# Patient Record
Sex: Female | Born: 1995 | Race: Black or African American | Hispanic: No | Marital: Single | Smoking: Never smoker
Health system: Southern US, Community
[De-identification: ages and names within clinical notes are randomized; demographics above are authoritative.]

---

## 2017-06-28 ENCOUNTER — Encounter (HOSPITAL_COMMUNITY): Payer: Self-pay | Admitting: Emergency Medicine

## 2017-06-28 ENCOUNTER — Ambulatory Visit (HOSPITAL_COMMUNITY)
Admission: EM | Admit: 2017-06-28 | Discharge: 2017-06-28 | Disposition: A | Discharge status: Home / Self Care | Payer: BLUE CROSS/BLUE SHIELD | Attending: Physician Assistant | Admitting: Physician Assistant

## 2017-06-28 DIAGNOSIS — Z882 Allergy status to sulfonamides status: Secondary | ICD-10-CM | POA: Diagnosis not present

## 2017-06-28 DIAGNOSIS — J039 Acute tonsillitis, unspecified: Secondary | ICD-10-CM

## 2017-06-28 DIAGNOSIS — R1310 Dysphagia, unspecified: Secondary | ICD-10-CM | POA: Diagnosis not present

## 2017-06-28 DIAGNOSIS — R05 Cough: Secondary | ICD-10-CM | POA: Insufficient documentation

## 2017-06-28 DIAGNOSIS — J029 Acute pharyngitis, unspecified: Secondary | ICD-10-CM | POA: Diagnosis present

## 2017-06-28 DIAGNOSIS — Z79899 Other long term (current) drug therapy: Secondary | ICD-10-CM | POA: Diagnosis not present

## 2017-06-28 DIAGNOSIS — R070 Pain in throat: Secondary | ICD-10-CM

## 2017-06-28 DIAGNOSIS — Z91018 Allergy to other foods: Secondary | ICD-10-CM | POA: Diagnosis not present

## 2017-06-28 LAB — POCT INFECTIOUS MONO SCREEN: Mono Screen: NEGATIVE

## 2017-06-28 LAB — POCT RAPID STREP A: Streptococcus, Group A Screen (Direct): NEGATIVE

## 2017-06-28 MED ORDER — PHENOL 1.4 % MT LIQD: 1.0000 | OROMUCOSAL | 0 refills | Status: DC | PRN

## 2017-06-28 MED ORDER — FLUTICASONE PROPIONATE 50 MCG/ACT NA SUSP: 2.0000 | Freq: Every day | NASAL | 0 refills | Status: AC

## 2017-06-28 MED ORDER — CETIRIZINE-PSEUDOEPHEDRINE ER 5-120 MG PO TB12: 1.0000 | ORAL_TABLET | Freq: Every day | ORAL | 0 refills | Status: DC

## 2017-06-28 MED ORDER — PENICILLIN V POTASSIUM 500 MG PO TABS: 500.0000 mg | ORAL_TABLET | Freq: Two times a day (BID) | ORAL | 0 refills | Status: DC

## 2017-06-28 NOTE — ED Provider Notes (Signed)
[facility]    CSN: [id number] Arrival date & time: 06/28/17  1046     History   Chief Complaint Chief Complaint  Patient presents with  . Sore Throat    HPI Lori Anderson is a 21 y.o. female.   21 year old female with 3 day history of sorethroat with dysphagia. Has had some mild congestion and coughing. Feeling weak and tired. Denies ear pain, eye pain. Has had some night sweats, without fever/chills. Cough can be productive. Painful with swallowing but able to go down, denies trouble breathing. Denies chest pain, shortness of breath, wheezing. Has tried cough drops, ibuprofen without relief. Denies sick contact. Denies history of seasonal allergies, asthma.       History reviewed. No pertinent past medical history.  There are no active problems to display for this patient.   History reviewed. No pertinent surgical history.  OB History    No data available       Home Medications    Prior to Admission medications   Medication Sig Start Date End Date Taking? Authorizing Provider  norethindrone-ethinyl estradiol-iron (MICROGESTIN FE,GILDESS FE,LOESTRIN FE) 1.5-30 MG-MCG tablet Take 1 tablet by mouth daily.   Yes [provider]  cetirizine-pseudoephedrine (ZYRTEC-D) 5-120 MG tablet Take 1 tablet by mouth daily. 06/28/17   Cathie Hoops, Amy V, PA-C  fluticasone (FLONASE) 50 MCG/ACT nasal spray Place 2 sprays into both nostrils daily. 06/28/17   Cathie Hoops, Amy V, PA-C  penicillin v potassium (VEETID) 500 MG tablet Take 1 tablet (500 mg total) by mouth 2 (two) times daily. 06/28/17   Cathie Hoops, Amy V, PA-C  phenol (CHLORASEPTIC) 1.4 % LIQD Use as directed 1 spray in the mouth or throat as needed for throat irritation / pain. 06/28/17   Belinda Fisher, PA-C    Family History History reviewed. No pertinent family history.  Social History Social History  Substance Use Topics  . Smoking status: Never Smoker  . Smokeless tobacco: Never Used  . Alcohol use No     Allergies     Cinnamon and Sulfa antibiotics   Review of Systems Review of Systems  Reason unable to perform ROS: See HPI as above.     Physical Exam Triage Vital Signs ED Triage Vitals  Enc Vitals Group     BP 06/28/17 1119 (!) 106/59     Pulse Rate 06/28/17 1119 75     Resp 06/28/17 1119 20     Temp 06/28/17 1119 98.6 F (37 C)     Temp Source 06/28/17 1119 Oral     SpO2 06/28/17 1119 100 %     Weight --      Height --      Head Circumference --      Peak Flow --      Pain Score 06/28/17 1120 5     Pain Loc --      Pain Edu? --      Excl. in GC? --    No data found.   Updated Vital Signs BP (!) 106/59 (BP Location: Left Arm)   Pulse 75   Temp 98.6 F (37 C) (Oral)   Resp 20   LMP 06/25/2017   SpO2 100%   Physical Exam  Constitutional: She is oriented to person, place, and time. She appears well-developed and well-nourished. No distress.  HENT:  Head: Normocephalic and atraumatic.  Right Ear: Tympanic membrane, external ear and ear canal normal. Tympanic membrane is not erythematous and not bulging.  Left Ear:  External ear and ear canal normal. Tympanic membrane is erythematous. Tympanic membrane is not bulging.  Nose: Mucosal edema and rhinorrhea present. Right sinus exhibits no maxillary sinus tenderness and no frontal sinus tenderness. Left sinus exhibits no maxillary sinus tenderness and no frontal sinus tenderness.  Mouth/Throat: Uvula is midline and mucous membranes are normal. Posterior oropharyngeal erythema present. Tonsils are 3+ on the right. Tonsils are 3+ on the left. Tonsillar exudate (bilateral).  Eyes: Pupils are equal, round, and reactive to light. Conjunctivae are normal.  Neck: Normal range of motion. Neck supple.  Cardiovascular: Normal rate, regular rhythm and normal heart sounds.  Exam reveals no gallop and no friction rub.   No murmur heard. Pulmonary/Chest: Effort normal and breath sounds normal. She has no decreased breath sounds. She has no wheezes.  She has no rhonchi. She has no rales.  Lymphadenopathy:    She has no cervical adenopathy.  Neurological: She is alert and oriented to person, place, and time.  Skin: Skin is warm and dry.  Psychiatric: She has a normal mood and affect. Her behavior is normal. Judgment normal.     UC Treatments / Results  Labs (all labs ordered are listed, but only abnormal results are displayed) Labs Reviewed  CULTURE, GROUP A STREP [facility])  POCT RAPID STREP A  POCT INFECTIOUS MONO SCREEN    EKG  EKG Interpretation None       Radiology No results found.  Procedures Procedures (including critical care time)  Medications Ordered in UC Medications - No data to display   Initial Impression / Assessment and Plan / UC Course  I have reviewed the triage vital signs and the nursing notes.  Pertinent labs & imaging results that were available during my care of the patient were reviewed by me and considered in my medical decision making (see chart for details).    Negative strep and mono. Given exam, will treat for tonsillitis, start PCN as directed. Other symptomatic treatment provided. Return precautions given.   Final Clinical Impressions(s) / UC Diagnoses   Final diagnoses:  Acute tonsillitis, unspecified etiology    New Prescriptions New Prescriptions   CETIRIZINE-PSEUDOEPHEDRINE (ZYRTEC-D) 5-120 MG TABLET    Take 1 tablet by mouth daily.   FLUTICASONE (FLONASE) 50 MCG/ACT NASAL SPRAY    Place 2 sprays into both nostrils daily.   PENICILLIN V POTASSIUM (VEETID) 500 MG TABLET    Take 1 tablet (500 mg total) by mouth 2 (two) times daily.   PHENOL (CHLORASEPTIC) 1.4 % LIQD    Use as directed 1 spray in the mouth or throat as needed for throat irritation / pain.      Belinda Fisher, PA-C 06/28/17 1257

## 2017-06-28 NOTE — Discharge Instructions (Signed)
Rapid strep negative. Negative mono. Given exam will treat for tonsillitis, start penicillin as directed.  Start phenol for sore throat. Flonase and/or Zyrtec-D for nasal congestion. You can use over the counter nasal saline rinse such as neti pot for nasal congestion. Monitor for any worsening of symptoms, swelling of the throat, trouble breathing, trouble swallowing, follow up for reevaluation.

## 2017-06-28 NOTE — ED Triage Notes (Signed)
Pt here for ST onset 3 days associated dysphagia  Denies fevers, chills  A&O x4... NAD... Ambulatory

## 2017-07-01 LAB — CULTURE, GROUP A STREP (THRC)

## 2017-08-24 ENCOUNTER — Ambulatory Visit (INDEPENDENT_AMBULATORY_CARE_PROVIDER_SITE_OTHER): Payer: BLUE CROSS/BLUE SHIELD

## 2017-08-24 ENCOUNTER — Encounter (HOSPITAL_COMMUNITY): Payer: Self-pay | Admitting: Family Medicine

## 2017-08-24 ENCOUNTER — Ambulatory Visit (HOSPITAL_COMMUNITY)
Admission: EM | Admit: 2017-08-24 | Discharge: 2017-08-24 | Disposition: A | Discharge status: Home / Self Care | Payer: BLUE CROSS/BLUE SHIELD | Attending: Family Medicine | Admitting: Family Medicine

## 2017-08-24 DIAGNOSIS — S40021A Contusion of right upper arm, initial encounter: Secondary | ICD-10-CM

## 2017-08-24 DIAGNOSIS — S20211A Contusion of right front wall of thorax, initial encounter: Secondary | ICD-10-CM

## 2017-08-24 MED ORDER — CYCLOBENZAPRINE HCL 5 MG PO TABS: 5.0000 mg | ORAL_TABLET | Freq: Every day | ORAL | 0 refills | Status: DC

## 2017-08-24 MED ORDER — DICLOFENAC SODIUM 75 MG PO TBEC: 75.0000 mg | DELAYED_RELEASE_TABLET | Freq: Two times a day (BID) | ORAL | 0 refills | Status: DC

## 2017-08-24 NOTE — ED Triage Notes (Signed)
Pt here for right arm and shoulder pain. Reports that she injured it by sliding down the stairs today,

## 2017-08-24 NOTE — Discharge Instructions (Signed)
X-rays show no fractures.

## 2017-08-24 NOTE — ED Provider Notes (Signed)
[facility]   [id number] 08/24/17 Arrival Time: 1539   SUBJECTIVE:  Lori Anderson is a 21 y.o. female who presents to the urgent care with complaint of right arm and shoulder pain. Reports that she injured it by sliding down the stairs today,   No shortness of breath or head injury Patient complains of pain in her right ribs and aching in her humerus and forearm.  She has pain when she tries to raise her arm over her head.  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   History reviewed. No pertinent past medical history. History reviewed. No pertinent family history. Social History   Socioeconomic History  . Marital status: Single    Spouse name: Not on file  . Number of children: Not on file  . Years of education: Not on file  . Highest education level: Not on file  Social Needs  . Financial resource strain: Not on file  . Food insecurity - worry: Not on file  . Food insecurity - inability: Not on file  . Transportation needs - medical: Not on file  . Transportation needs - non-medical: Not on file  Occupational History  . Not on file  Tobacco Use  . Smoking status: Never Smoker  . Smokeless tobacco: Never Used  Substance and Sexual Activity  . Alcohol use: No  . Drug use: No  . Sexual activity: Yes    Birth control/protection: Pill, Condom  Other Topics Concern  . Not on file  Social History Narrative  . Not on file   No outpatient medications have been marked as taking for the 08/24/17 encounter [facility](Hospital Encounter).   Allergies  Allergen Reactions  . Cinnamon Itching  . Sulfa Antibiotics Swelling      ROS: As per HPI, remainder of ROS negative.   OBJECTIVE:   Vitals:   08/24/17 1608  BP: 113/68  Pulse: 73  Resp: 18  Temp: 98.2 F (36.8 C)  SpO2: 99%     General appearance: alert; no distress Eyes: PERRL; EOMI; conjunctiva normal HENT: normocephalic;  atraumatic; external ears normal without trauma; nasal mucosa normal; oral mucosa normal Neck: supple Lungs: clear to auscultation bilaterally; tender right anterolateral ribs Heart: regular rate and rhythm Abdomen: soft, non-tender;  Back: no CVA tenderness Extremities: no cyanosis or edema; symmetrical with no gross deformities Skin: warm and dry Neurologic: normal gait; grossly normal Psychological: alert and cooperative; normal mood and affect      Labs:  Results for orders placed or performed during the hospital encounter of 06/28/17  Culture, group A strep  Result Value Ref Range   Specimen Description THROAT    Special Requests NONE    Culture NO GROUP A STREP (S.PYOGENES) ISOLATED    Report Status 07/01/2017 FINAL   POCT rapid strep A [facility])  Result Value Ref Range   Streptococcus, Group A Screen (Direct) NEGATIVE NEGATIVE  Infectious mono screen, POC  Result Value Ref Range   Mono Screen NEGATIVE NEGATIVE    Labs Reviewed - No data to display  No results found.     ASSESSMENT & PLAN:  1. Contusion of right chest wall, initial encounter   2. Arm contusion, right, initial encounter     Meds ordered this encounter  Medications  . diclofenac (VOLTAREN) 75 MG EC tablet    Sig: Take 1 tablet (75 mg total) by mouth 2 (two) times daily.    Dispense:  14  tablet    Refill:  0  . cyclobenzaprine (FLEXERIL) 5 MG tablet    Sig: Take 1 tablet (5 mg total) by mouth at bedtime.    Dispense:  7 tablet    Refill:  0    Reviewed expectations re: course of current medical issues. Questions answered. Outlined signs and symptoms indicating need for more acute intervention. Patient verbalized understanding. After Visit Summary given.    Procedures:      Elvina SidleLauenstein, Savannaha Stonerock, MD 08/24/17 1728

## 2018-08-04 ENCOUNTER — Ambulatory Visit (HOSPITAL_COMMUNITY)
Admission: EM | Admit: 2018-08-04 | Discharge: 2018-08-04 | Disposition: A | Discharge status: Home / Self Care | Payer: BLUE CROSS/BLUE SHIELD | Attending: Family Medicine | Admitting: Family Medicine

## 2018-08-04 ENCOUNTER — Encounter (HOSPITAL_COMMUNITY): Payer: Self-pay | Admitting: Emergency Medicine

## 2018-08-04 DIAGNOSIS — M549 Dorsalgia, unspecified: Secondary | ICD-10-CM | POA: Insufficient documentation

## 2018-08-04 DIAGNOSIS — J039 Acute tonsillitis, unspecified: Secondary | ICD-10-CM | POA: Diagnosis not present

## 2018-08-04 DIAGNOSIS — H9201 Otalgia, right ear: Secondary | ICD-10-CM | POA: Insufficient documentation

## 2018-08-04 DIAGNOSIS — Z882 Allergy status to sulfonamides status: Secondary | ICD-10-CM | POA: Diagnosis not present

## 2018-08-04 LAB — POCT URINALYSIS DIP (DEVICE)
Bilirubin Urine: NEGATIVE
Glucose, UA: NEGATIVE mg/dL
Hgb urine dipstick: NEGATIVE
Ketones, ur: NEGATIVE mg/dL
Leukocytes, UA: NEGATIVE
Nitrite: NEGATIVE
Protein, ur: NEGATIVE mg/dL
Specific Gravity, Urine: 1.02 (ref 1.005–1.030)
Urobilinogen, UA: 0.2 mg/dL (ref 0.0–1.0)
pH: 7 (ref 5.0–8.0)

## 2018-08-04 LAB — POCT RAPID STREP A: Streptococcus, Group A Screen (Direct): NEGATIVE

## 2018-08-04 MED ORDER — IBUPROFEN 600 MG PO TABS: 600.0000 mg | ORAL_TABLET | Freq: Four times a day (QID) | ORAL | 0 refills | Status: AC | PRN

## 2018-08-04 MED ORDER — CYCLOBENZAPRINE HCL 5 MG PO TABS: 5.0000 mg | ORAL_TABLET | Freq: Every day | ORAL | 0 refills | Status: AC

## 2018-08-04 NOTE — Discharge Instructions (Addendum)
Sore Throat  Your rapid strep tested Negative today. We will send for a culture and call in about 2 days if results are positive. For now we will treat your sore throat as a virus with symptom management.   Please continue Tylenol or Ibuprofen for fever and pain. May try salt water gargles, cepacol lozenges, throat spray, or OTC cold relief medicine for throat discomfort. If you also have congestion take a daily anti-histamine like Zyrtec, Claritin, and a oral decongestant to help with post nasal drip that may be irritating your throat.   Stay hydrated and drink plenty of fluids to keep your throat coated relieve irritation.   Please use ibuprofen and flexeril for back pain Use anti-inflammatories for pain/swelling. You may take up to 800 mg Ibuprofen every 8 hours with food. You may supplement Ibuprofen with Tylenol [phone] mg every 8 hours.   You may use flexeril as needed to help with pain. This is a muscle relaxer and causes sedation- please use only at bedtime or when you will be home and not have to drive/work  Please follow up if not improving or worsening

## 2018-08-04 NOTE — ED Triage Notes (Signed)
Pt sts right ear pain and right side and back pain x 3 days

## 2018-08-05 NOTE — ED Provider Notes (Signed)
[facility]    CSN: [id number] Arrival date & time: 08/04/18  1236     History   Chief Complaint Chief Complaint  Patient presents with  . Otalgia  . Flank Pain    HPI Lori Anderson is a 22 y.o. female no significant past medical history of present today for evaluation right ear pain, sore throat as well as back pain.  Patient states that for the past 2 days she is developed right-sided ear pain with a mild sore throat.  He states that she has a history of getting recurrent tonsillitis and has an evaluation of her tonsils removed soon.  She denies fevers.  Denies associated rhinorrhea, congestion or cough.  She has tried Motrin which has helped a little. She is also had right-side/ back pain.  This is been present for the past 3 days.  Worse with certain movements.  She denies dysuria, has noted increased frequency of urination.  Denies hematuria.  Denies history of kidney stones.  The Motrin has also provided her with mild relief with this.  Denies any specific injury, increase in activity or heavy lifting.  Denies saddle anesthesia.  Denies weakness in legs.  Denies loss of bowel or bladder control.  HPI  History reviewed. No pertinent past medical history.  There are no active problems to display for this patient.   History reviewed. No pertinent surgical history.  OB History   None      Home Medications    Prior to Admission medications   Medication Sig Start Date End Date Taking? Authorizing Provider  cyclobenzaprine (FLEXERIL) 5 MG tablet Take 1 tablet (5 mg total) by mouth at bedtime for 7 days. 08/04/18 08/11/18  Shakala Marlatt C, PA-C  fluticasone (FLONASE) 50 MCG/ACT nasal spray Place 2 sprays into both nostrils daily. 06/28/17   Cathie HoopsYu, Amy V, PA-C  ibuprofen (ADVIL,MOTRIN) 600 MG tablet Take 1 tablet (600 mg total) by mouth every 6 (six) hours as needed. 08/04/18   Dinari Stgermaine C, PA-C  norethindrone-ethinyl estradiol-iron (MICROGESTIN FE,GILDESS  FE,LOESTRIN FE) 1.5-30 MG-MCG tablet Take 1 tablet by mouth daily.    [provider]    Family History History reviewed. No pertinent family history.  Social History Social History   Tobacco Use  . Smoking status: Never Smoker  . Smokeless tobacco: Never Used  Substance Use Topics  . Alcohol use: No  . Drug use: No     Allergies   Cinnamon and Sulfa antibiotics   Review of Systems Review of Systems  Constitutional: Negative for activity change, appetite change, chills, fatigue and fever.  HENT: Positive for ear pain and sore throat. Negative for congestion, rhinorrhea, sinus pressure and trouble swallowing.   Eyes: Negative for discharge and redness.  Respiratory: Negative for cough, chest tightness and shortness of breath.   Cardiovascular: Negative for chest pain.  Gastrointestinal: Negative for abdominal pain, diarrhea, nausea and vomiting.  Genitourinary: Positive for frequency. Negative for decreased urine volume, difficulty urinating, dysuria and vaginal discharge.  Musculoskeletal: Positive for back pain and myalgias.  Skin: Negative for rash.  Neurological: Negative for dizziness, light-headedness and headaches.     Physical Exam Triage Vital Signs ED Triage Vitals [08/04/18 1320]  Enc Vitals Group     BP 121/86     Pulse Rate 72     Resp 18     Temp 98.3 F (36.8 C)     Temp Source Oral     SpO2 100 %  Weight      Height      Head Circumference      Peak Flow      Pain Score 6     Pain Loc      Pain Edu?      Excl. in GC?    No data found.  Updated Vital Signs BP 121/86 (BP Location: Left Arm)   Pulse 72   Temp 98.3 F (36.8 C) (Oral)   Resp 18   SpO2 100%   Visual Acuity Right Eye Distance:   Left Eye Distance:   Bilateral Distance:    Right Eye Near:   Left Eye Near:    Bilateral Near:     Physical Exam  Constitutional: She appears well-developed and well-nourished. No distress.  HENT:  Head: Normocephalic and  atraumatic.  Bilateral ears without tenderness to palpation of external auricle, tragus and mastoid, right EAC very minimally erythematous, without swelling, TM's with good bony landmarks and cone of light. Non erythematous.  Nasal mucosa pink, no rhinorrhea  Oral mucosa pink and moist, right tonsil enlarged compared to left, no erythema or exudate present, no soft palate swelling. Posterior pharynx patent and nonerythematous, no uvula deviation or swelling. Normal phonation.  Eyes: Conjunctivae are normal.  Neck: Neck supple.  Cardiovascular: Normal rate and regular rhythm.  No murmur heard. Pulmonary/Chest: Effort normal and breath sounds normal. No respiratory distress.  Breathing comfortably at rest, CTABL, no wheezing, rales or other adventitious sounds auscultated  Abdominal: Soft. There is no tenderness.  Nontender to light deep palpation throughout abdomen  Musculoskeletal: She exhibits no edema.  Nontender to palpation of cervical, thoracic and lumbar spine midline, mild tenderness to palpation over right lumbar musculature into flank  Neurological: She is alert.  Skin: Skin is warm and dry.  Psychiatric: She has a normal mood and affect.  Nursing note and vitals reviewed.    UC Treatments / Results  Labs (all labs ordered are listed, but only abnormal results are displayed) Labs Reviewed  CULTURE, GROUP A STREP [facility])  POCT RAPID STREP A  POCT URINALYSIS DIP (DEVICE)    EKG None  Radiology No results found.  Procedures Procedures (including critical care time)  Medications Ordered in UC Medications - No data to display  Initial Impression / Assessment and Plan / UC Course  I have reviewed the triage vital signs and the nursing notes.  Pertinent labs & imaging results that were available during my care of the patient were reviewed by me and considered in my medical decision making (see chart for details).     Ear pain likely secondary to enlarged right  tonsil, strep negative, does not appear to be.  Abscess at this time although unilateral swelling.  No overlying erythema, will treat as viral, recommend symptomatic management.  Recommendations below.  UA checked given flank pain, normal.  Most likely musculoskeletal given reproducible on exam.  Will also recommend anti-inflammatories/muscle relaxer.  Continue to monitor symptoms,Discussed strict return precautions. Patient verbalized understanding and is agreeable with plan.  Final Clinical Impressions(s) / UC Diagnoses   Final diagnoses:  Right ear pain  Acute tonsillitis, unspecified etiology     Discharge Instructions     Sore Throat  Your rapid strep tested Negative today. We will send for a culture and call in about 2 days if results are positive. For now we will treat your sore throat as a virus with symptom management.   Please continue Tylenol or Ibuprofen for fever and  pain. May try salt water gargles, cepacol lozenges, throat spray, or OTC cold relief medicine for throat discomfort. If you also have congestion take a daily anti-histamine like Zyrtec, Claritin, and a oral decongestant to help with post nasal drip that may be irritating your throat.   Stay hydrated and drink plenty of fluids to keep your throat coated relieve irritation.   Please use ibuprofen and flexeril for back pain Use anti-inflammatories for pain/swelling. You may take up to 800 mg Ibuprofen every 8 hours with food. You may supplement Ibuprofen with Tylenol [phone] mg every 8 hours.   You may use flexeril as needed to help with pain. This is a muscle relaxer and causes sedation- please use only at bedtime or when you will be home and not have to drive/work  Please follow up if not improving or worsening   ED Prescriptions    Medication Sig Dispense Auth. Provider   cyclobenzaprine (FLEXERIL) 5 MG tablet Take 1 tablet (5 mg total) by mouth at bedtime for 7 days. 7 tablet Rodolphe Edmonston C, PA-C    ibuprofen (ADVIL,MOTRIN) 600 MG tablet Take 1 tablet (600 mg total) by mouth every 6 (six) hours as needed. 30 tablet Lavi Sheehan, [place] C, PA-C     Controlled Substance Prescriptions [place] Controlled Substance Registry consulted? Not Applicable   Lew Dawes, [place] 08/05/18 [phone]

## 2018-08-07 LAB — CULTURE, GROUP A STREP (THRC)

## 2019-11-20 IMAGING — DX DG SHOULDER 2+V*R*
4 series · 4 of 4 positions shown · non-contrast
Comparison: None.

CLINICAL DATA: Fall with right rib and shoulder pain. Initial
encounter.

EXAM:
RIGHT SHOULDER - 2+ VIEW

[shoulder ap]
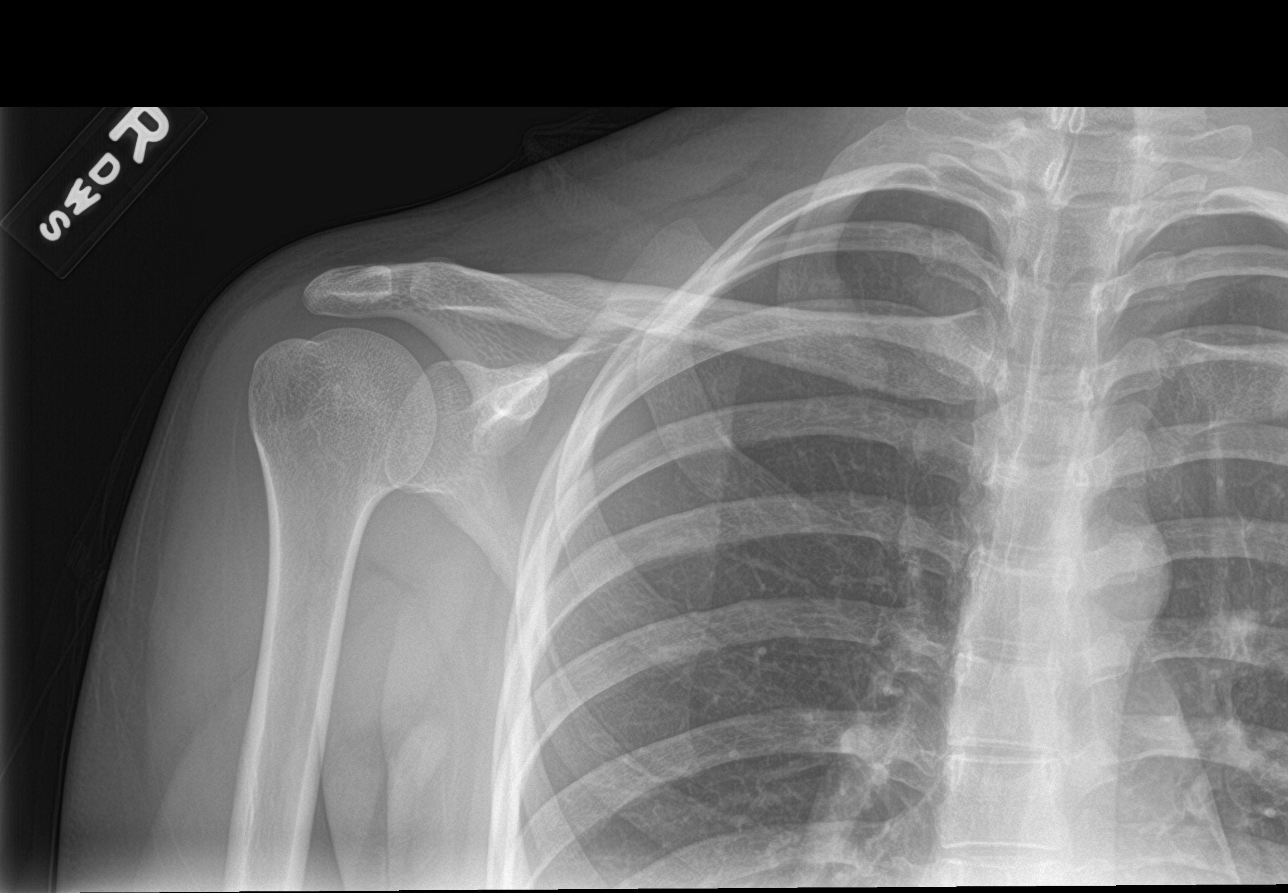

[shoulder grashey]
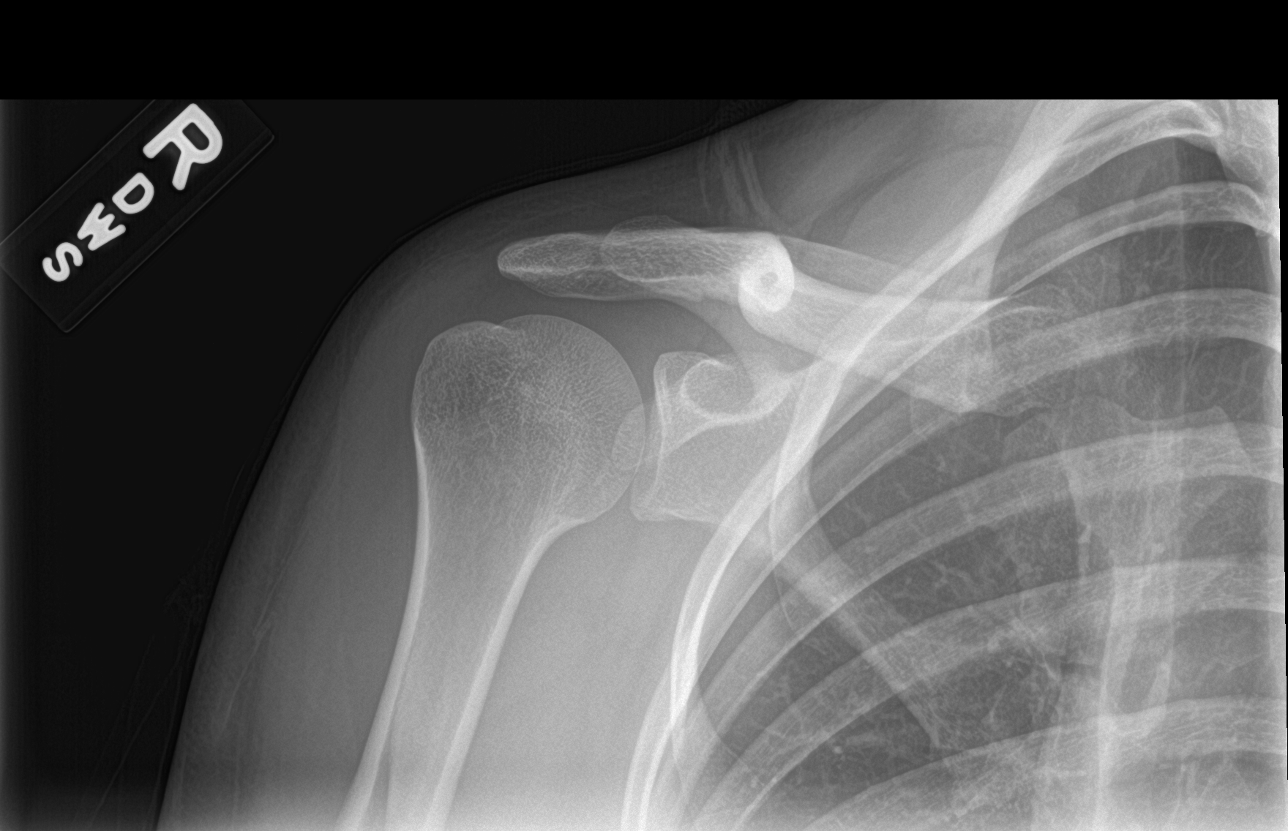

[shoulder y-view]
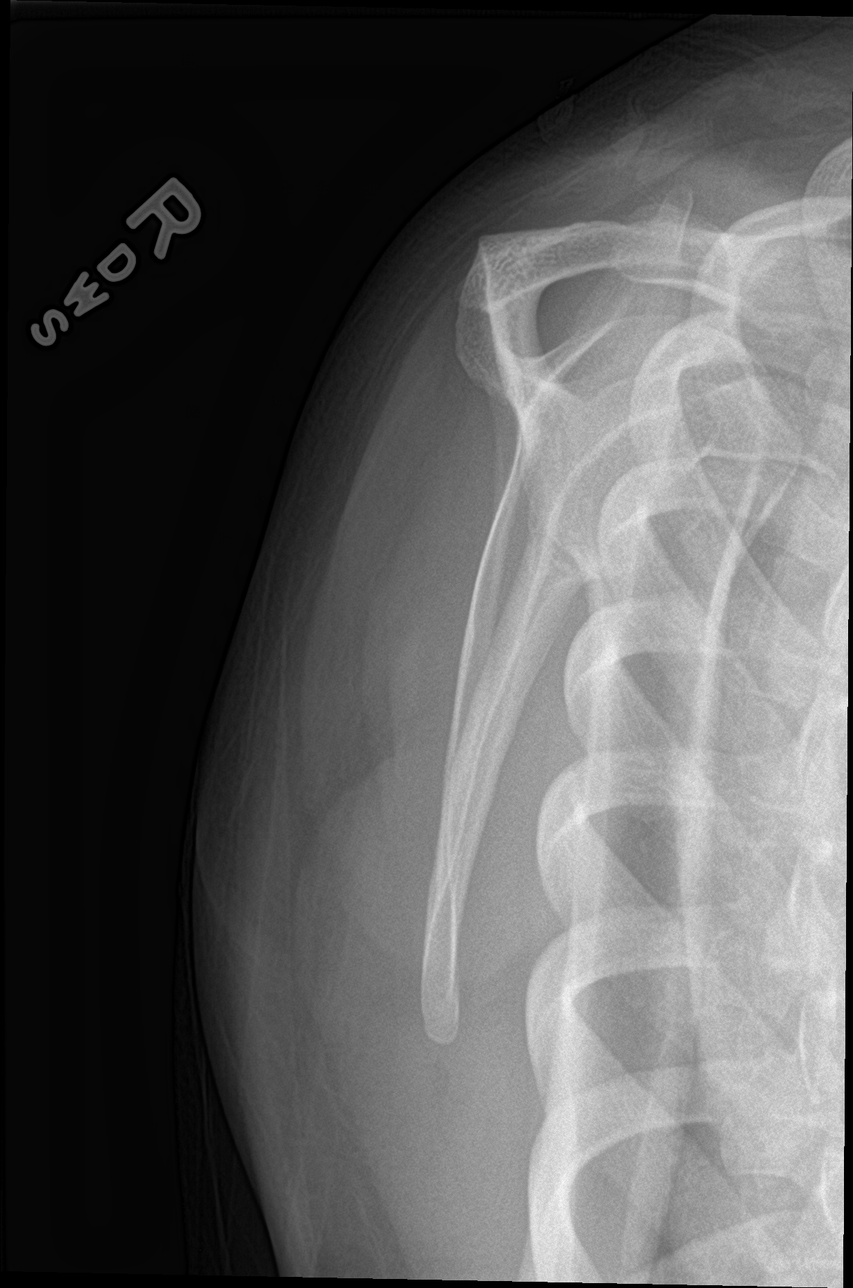

[shoulder axial]
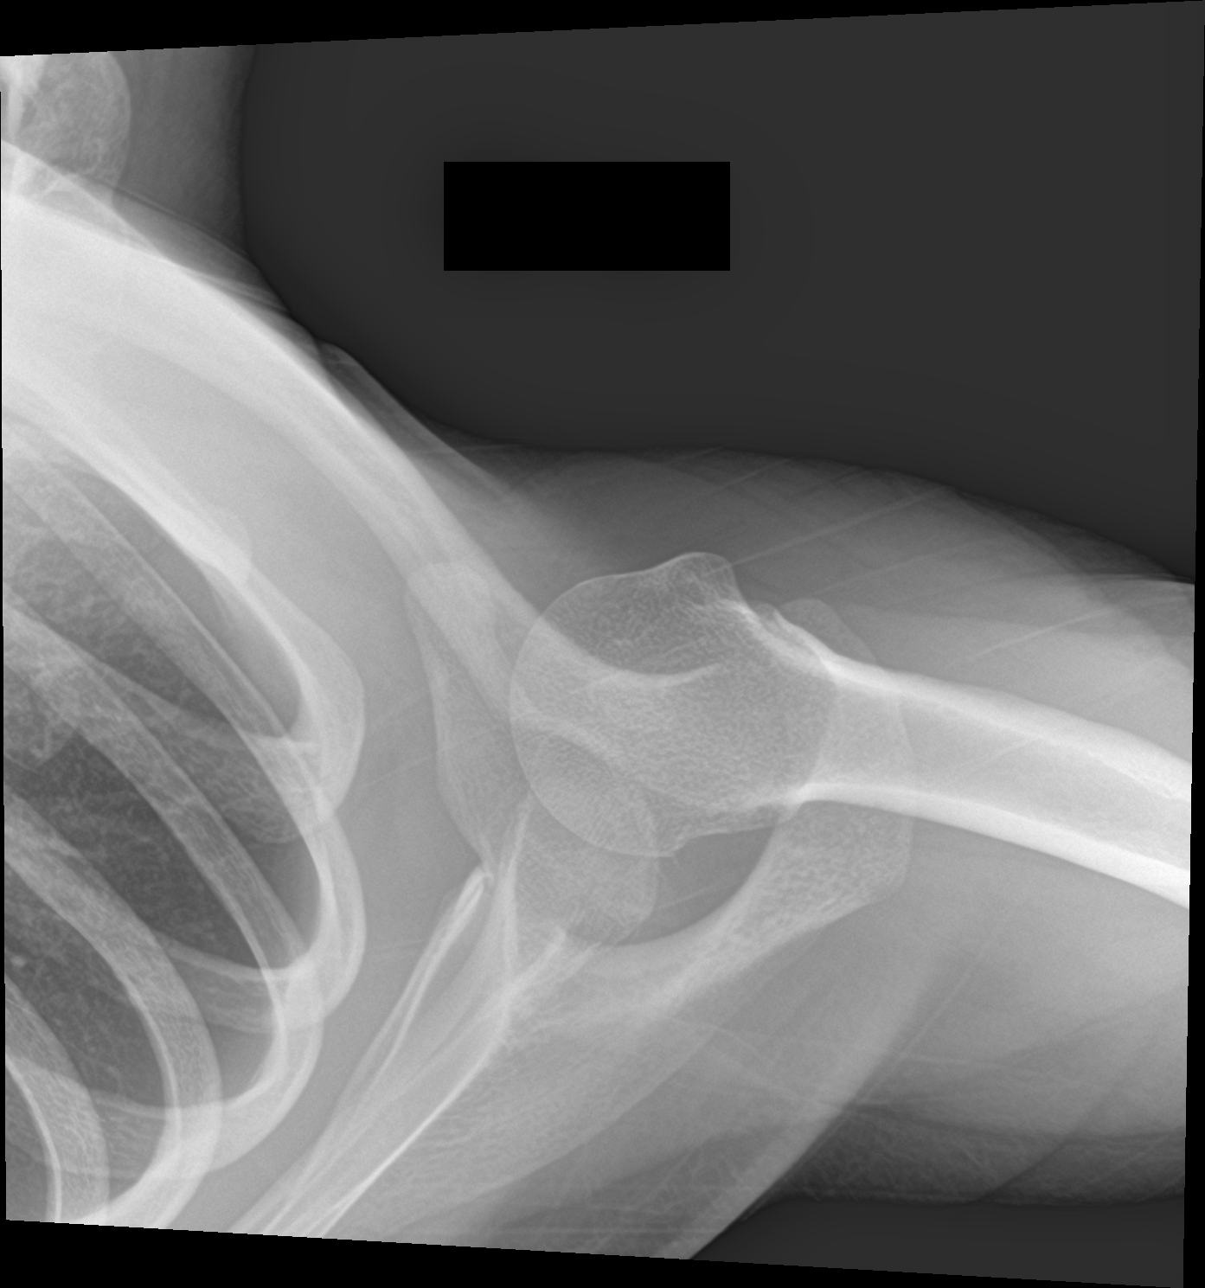

[4 of 4 positions shown; findings below may reference images not displayed]

FINDINGS: There is no evidence of fracture or dislocation. There is no
evidence of arthropathy or other focal bone abnormality. Soft
tissues are unremarkable.
IMPRESSION: Negative.

## 2019-12-26 ENCOUNTER — Ambulatory Visit: Payer: BC Managed Care – PPO | Attending: Family

## 2019-12-26 DIAGNOSIS — Z23 Encounter for immunization: Secondary | ICD-10-CM

## 2019-12-26 NOTE — Progress Notes (Signed)
   Covid-19 Vaccination Clinic  Name:  Lori Anderson    MRN: [id number] DOB: 07/17/1996  12/26/2019  [name] was observed post Covid-19 immunization for 15 minutes without incident. She was provided with Vaccine Information Sheet and instruction to access the V-Safe system.   [name] was instructed to call 911 with any severe reactions post vaccine: Marland Kitchen Difficulty breathing  . Swelling of face and throat  . A fast heartbeat  . A bad rash all over body  . Dizziness and weakness   Immunizations Administered    Name Date Dose VIS Date Route   JANSSEN COVID-19 VACCINE 12/26/2019 12:13 PM 0.5 mL 11/21/2019 Intramuscular   Manufacturer: [employer]   Lot: [id number]   NDC: 20254-270-62

## 2020-06-15 ENCOUNTER — Ambulatory Visit: Payer: BC Managed Care – PPO | Admitting: Podiatry

## 2020-06-16 ENCOUNTER — Ambulatory Visit (INDEPENDENT_AMBULATORY_CARE_PROVIDER_SITE_OTHER): Payer: BC Managed Care – PPO | Admitting: Podiatry

## 2020-06-16 ENCOUNTER — Ambulatory Visit (INDEPENDENT_AMBULATORY_CARE_PROVIDER_SITE_OTHER): Payer: BC Managed Care – PPO

## 2020-06-16 ENCOUNTER — Other Ambulatory Visit: Payer: Self-pay

## 2020-06-16 DIAGNOSIS — Q666 Other congenital valgus deformities of feet: Secondary | ICD-10-CM

## 2020-06-16 DIAGNOSIS — M21611 Bunion of right foot: Secondary | ICD-10-CM

## 2020-06-17 ENCOUNTER — Encounter: Payer: Self-pay | Admitting: Podiatry

## 2020-06-17 NOTE — Progress Notes (Signed)
Subjective:  Patient ID: Lori Anderson, female    DOB: 09-10-1996,  MRN: [id number]  Chief Complaint  Patient presents with  . Foot Pain    Right foot- pt states she is has been dealing with a dull/ throbbing ache- almost a month- no meds taken - further evlaution     24 y.o. female presents with the above complaint. Patient presents with complaint of right bunion pain that has been going on for 1 month. She states that she has mild occasional discomfort. She just wants more information on the bunion and the surgical treatment options. She has tried various conservative treatment options. She does not want to do surgery at this time however she wants to discuss options for it. She states that starts throbbing and achy when she is standing on it for long period of time. She has not seen anyone else prior to seeing me.   Review of Systems: Negative except as noted in the HPI. Denies N/V/F/Ch.  No past medical history on file.  Current Outpatient Medications:  .  clindamycin (CLEOCIN T) 1 % lotion, Apply topically., Disp: , Rfl:  .  fluticasone (FLONASE) 50 MCG/ACT nasal spray, Place 2 sprays into both nostrils daily., Disp: 1 g, Rfl: 0 .  ibuprofen (ADVIL,MOTRIN) 600 MG tablet, Take 1 tablet (600 mg total) by mouth every 6 (six) hours as needed., Disp: 30 tablet, Rfl: 0 .  LO LOESTRIN FE 1 MG-10 MCG / 10 MCG tablet, Take 1 tablet by mouth daily., Disp: , Rfl:  .  norethindrone-ethinyl estradiol-iron (MICROGESTIN FE,GILDESS FE,LOESTRIN FE) 1.5-30 MG-MCG tablet, Take 1 tablet by mouth daily., Disp: , Rfl:  .  tretinoin (RETIN-A) 0.025 % cream, Apply topically., Disp: , Rfl:   Social History   Tobacco Use  Smoking Status Never Smoker  Smokeless Tobacco Never Used    Allergies  Allergen Reactions  . Sulfa Antibiotics Swelling and Anaphylaxis  . Cinnamon Itching   Objective:  There were no vitals filed for this visit. There is no height or weight on file to calculate  BMI. Constitutional Well developed. Well nourished.  Vascular Dorsalis pedis pulses palpable bilaterally. Posterior tibial pulses palpable bilaterally. Capillary refill normal to all digits.  No cyanosis or clubbing noted. Pedal hair growth normal.  Neurologic Normal speech. Oriented to person, place, and time. Epicritic sensation to light touch grossly present bilaterally.  Dermatologic Nails well groomed and normal in appearance. No open wounds. No skin lesions.  Orthopedic:  Right bunion deformity noted with pain on palpationReducible deformity with tracking not track bound. No intra-articular MPJ joint pain noted. Hallux valgus noted. Mild hammertoe contracture right l second digit noted. Gait examination shows calcaneal valgus with too many toe signs flattening of the arch upon weightbearing right partial of the recurrently arch with dorsiflexion of the hallux   Radiographs: 3 views of skeletally mature right foot: Moderate bunion deformity noted with increase in intermetatarsal angle. There is increasing hallux abductus valgus angle. Sesamoid position is 5 out of 7. Metatarsal parabola is intact. Hypertrophy of the medial eminence noted bilaterally. Mild hammertoe contracture of digit 2 noted bilaterally. Assessment:   1. Asymptomatic bunion of right foot   2. Pes planovalgus    Plan:  Patient was evaluated and treated and all questions answered.  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 I explained to the patient that the driving force behind the bunion deformities  a flatfeet/pes planovalgus. Ultimately we will need to control the underlying flatfeet in order for bunions to function properly regarding less of the surgery may. At this time patient would like to hold off on surgical discussion however she is interested in surgery in the future if it  continues to hurt. At this time patient will benefit from custom-made orthotics to control the hindfoot motion and support the arch of the foot and therefore stabilize her pes planovalgus. -She will be scheduled see rec for custom-made orthotics -  No follow-ups on file.

## 2020-06-21 ENCOUNTER — Other Ambulatory Visit: Payer: Self-pay

## 2020-06-21 ENCOUNTER — Ambulatory Visit (INDEPENDENT_AMBULATORY_CARE_PROVIDER_SITE_OTHER): Payer: BC Managed Care – PPO | Admitting: Orthotics

## 2020-06-21 DIAGNOSIS — M21611 Bunion of right foot: Secondary | ICD-10-CM

## 2020-06-21 DIAGNOSIS — Q666 Other congenital valgus deformities of feet: Secondary | ICD-10-CM | POA: Diagnosis not present

## 2020-06-21 NOTE — Progress Notes (Signed)
Cast today for CMFO to address right bunion pain/plantar..Plan   k-wedge offload under 1st mpj

## 2020-07-13 ENCOUNTER — Ambulatory Visit (INDEPENDENT_AMBULATORY_CARE_PROVIDER_SITE_OTHER): Payer: BC Managed Care – PPO | Admitting: Podiatry

## 2020-07-13 ENCOUNTER — Other Ambulatory Visit: Payer: Self-pay

## 2020-07-13 DIAGNOSIS — Q666 Other congenital valgus deformities of feet: Secondary | ICD-10-CM

## 2020-07-13 DIAGNOSIS — Z01818 Encounter for other preprocedural examination: Secondary | ICD-10-CM | POA: Diagnosis not present

## 2020-07-13 DIAGNOSIS — M2011 Hallux valgus (acquired), right foot: Secondary | ICD-10-CM

## 2020-07-14 ENCOUNTER — Encounter: Payer: Self-pay | Admitting: Podiatry

## 2020-07-14 NOTE — Progress Notes (Signed)
Subjective:  Patient ID: Lori Anderson, female    DOB: [birth year],  MRN: [id number]  Chief Complaint  Patient presents with  . Bunions    would like to discuss surgery     24 y.o. female presents with the above complaint.  Patient presents with a follow-up of right bunion pain that seems to have gotten progressively worse.  Patient states that all the shoes are now starting to hurt.  She would now like to discuss surgical intervention to have it corrected.  She states that she lives in [place] and he cannot follow-up after the surgery however she has a local podiatrist I will be more than happy to take care of the bunion.  She will follow up with Dr. Signa Kell.  She denies any other acute complaints and would like to proceed with surgery as she has failed all conservative treatment options   Review of Systems: Negative except as noted in the HPI. Denies N/V/F/Ch.  No past medical history on file.  Current Outpatient Medications:  .  clindamycin (CLEOCIN T) 1 % lotion, Apply topically., Disp: , Rfl:  .  fluticasone (FLONASE) 50 MCG/ACT nasal spray, Place 2 sprays into both nostrils daily., Disp: 1 g, Rfl: 0 .  ibuprofen (ADVIL,MOTRIN) 600 MG tablet, Take 1 tablet (600 mg total) by mouth every 6 (six) hours as needed., Disp: 30 tablet, Rfl: 0 .  LO LOESTRIN FE 1 MG-10 MCG / 10 MCG tablet, Take 1 tablet by mouth daily., Disp: , Rfl:  .  norethindrone-ethinyl estradiol-iron (MICROGESTIN FE,GILDESS FE,LOESTRIN FE) 1.5-30 MG-MCG tablet, Take 1 tablet by mouth daily., Disp: , Rfl:  .  tretinoin (RETIN-A) 0.025 % cream, Apply topically., Disp: , Rfl:   Social History   Tobacco Use  Smoking Status Never Smoker  Smokeless Tobacco Never Used    Allergies  Allergen Reactions  . Sulfa Antibiotics Swelling and Anaphylaxis  . Cinnamon Itching   Objective:  There were no vitals filed for this visit. There is no height or weight on file to calculate BMI. Constitutional Well  developed. Well nourished.  Vascular Dorsalis pedis pulses palpable bilaterally. Posterior tibial pulses palpable bilaterally. Capillary refill normal to all digits.  No cyanosis or clubbing noted. Pedal hair growth normal.  Neurologic Normal speech. Oriented to person, place, and time. Epicritic sensation to light touch grossly present bilaterally.  Dermatologic Nails well groomed and normal in appearance. No open wounds. No skin lesions.  Orthopedic:  Right bunion deformity noted with pain on palpationReducible deformity with tracking not track bound. No intra-articular MPJ joint pain noted. Hallux valgus noted. Mild hammertoe contracture right l second digit noted. Gait examination shows calcaneal valgus with too many toe signs flattening of the arch upon weightbearing right partial of the recurrently arch with dorsiflexion of the hallux   Radiographs: 3 views of skeletally mature right foot: Moderate bunion deformity noted with increase in intermetatarsal angle. There is increasing hallux abductus valgus angle. Sesamoid position is 5 out of 7. Metatarsal parabola is intact. Hypertrophy of the medial eminence noted bilaterally. Mild hammertoe contracture of digit 2 noted bilaterally. Assessment:   1. Hallux abducto valgus, right   2. Pes planovalgus   3. Preoperative examination    Plan:  Patient was evaluated and treated and all questions answered.  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 I explained to the patient that the driving force behind the bunion  deformities a flatfeet/pes planovalgus. Ultimately we will need to control the underlying flatfeet in order for bunions to function properly regarding less of the surgery may. -I discussed with her my surgical plans in extensive detail given that patient has failed all conservative therapy at this time  given that her pain in the bunion is getting worse I discussed surgical plans with it.  Patient is from [place] and would like to have postop protocol and stitches removal done by Dr. Signa Kell at his clinic.  I discussed with the patient that that should be absolutely fine.  I plan on performing chevron osteotomy with a possible Akin osteotomy. -I discussed my postop protocol as well as my surgical plan in extensive detail with the patient.  Patient states understanding would like to proceed with surgery. -Informed surgical risk consent was reviewed and read aloud to the patient.  I reviewed the films.  I have discussed my findings with the patient in great detail.  I have discussed all risks including but not limited to infection, stiffness, scarring, limp, disability, deformity, damage to blood vessels and nerves, numbness, poor healing, need for braces, arthritis, chronic pain, amputation, death.  All benefits and realistic expectations discussed in great detail.  I have made no promises as to the outcome.  I have provided realistic expectations.  I have offered the patient a 2nd opinion, which they have declined and assured me they preferred to proceed despite the risks -A total of 33 minutes was spent in direct patient care as well as pre and post patient encounter activities.  This includes documentation as well as reviewing patient chart for labs, imaging, past medical, surgical, social, and family history as documented in the EMR.  I have reviewed medication allergies as documented in EMR.  I discussed the etiology of condition and treatment options from conservative to surgical care.  All risks and benefit of the treatment course was discussed in detail.  All questions were answered and return appointment was discussed.  Since the visit completed in an ambulatory/outpatient setting, the patient and/or parent/guardian has been advised to contact the providers office for worsening condition and seek  medical treatment and/or call 911 if the patient deems either is necessary.  Pes planovalgus -She is scheduled see rec for custom-made orthotics No follow-ups on file.

## 2020-07-20 ENCOUNTER — Other Ambulatory Visit: Payer: Self-pay

## 2020-07-20 ENCOUNTER — Ambulatory Visit: Payer: BC Managed Care – PPO | Admitting: Orthotics

## 2020-07-20 DIAGNOSIS — Q666 Other congenital valgus deformities of feet: Secondary | ICD-10-CM

## 2020-07-20 DIAGNOSIS — M2011 Hallux valgus (acquired), right foot: Secondary | ICD-10-CM

## 2020-07-20 NOTE — Progress Notes (Signed)
Patient came in today to pick up custom made foot orthotics.  The goals were accomplished and the patient reported no dissatisfaction with said orthotics.  Patient was advised of breakin period and how to report any issues. 

## 2020-08-04 ENCOUNTER — Telehealth: Payer: Self-pay | Admitting: Podiatry

## 2020-08-04 NOTE — Telephone Encounter (Signed)
Patient is in a boot and scheduled for surgery in December, she would like a Handicap pass.

## 2020-08-05 NOTE — Telephone Encounter (Signed)
That is fine she can get it for 6 months

## 2020-08-25 ENCOUNTER — Telehealth: Payer: Self-pay | Admitting: Podiatry

## 2020-08-25 NOTE — Telephone Encounter (Signed)
DOS: 09/05/2020  Procedures: Liane Comber Bunionectomy Rt [phone]) and Double Osteotomy Rt ([id number])  BCBS Effective From: 09/24/2014 - 09/23/9998  Deductible: $1,200 with $1,200 and $0 remaining. Out of Pocket: $3,500 with $1,338 met and $2,162 remains. CoInsurance: 20% Copay: $0  Per May R no prior authorization or referrals are required. Call reference [phone]

## 2020-09-05 DIAGNOSIS — M2011 Hallux valgus (acquired), right foot: Secondary | ICD-10-CM | POA: Diagnosis not present

## 2020-09-05 MED ORDER — OXYCODONE-ACETAMINOPHEN 5-325 MG PO TABS: 1.0000 | ORAL_TABLET | ORAL | 0 refills | Status: AC | PRN

## 2020-09-05 MED ORDER — IBUPROFEN 800 MG PO TABS: 800.0000 mg | ORAL_TABLET | Freq: Four times a day (QID) | ORAL | 1 refills | Status: DC | PRN

## 2020-09-05 NOTE — Addendum Note (Signed)
Addended by: Nicholes Rough on: 09/05/2020 07:19 AM   Modules accepted: Orders

## 2020-09-08 ENCOUNTER — Telehealth: Payer: Self-pay | Admitting: Podiatry

## 2020-09-08 NOTE — Telephone Encounter (Signed)
Yes please cancel all appointments for this patient. Thank you

## 2020-09-08 NOTE — Telephone Encounter (Signed)
Patients mom has requested appointments be canceled and they have scheduled follow ups in [place] where patient was taken to recovery. Patients mom also stated they were told by you that once appointments were made they could call in and have POV with [facility] canceled, Please advise

## 2020-09-14 ENCOUNTER — Encounter: Payer: BC Managed Care – PPO | Admitting: Podiatry

## 2020-09-28 ENCOUNTER — Encounter: Payer: BC Managed Care – PPO | Admitting: Podiatry

## 2020-10-10 ENCOUNTER — Other Ambulatory Visit: Payer: Self-pay | Admitting: Podiatry

## 2020-10-14 ENCOUNTER — Other Ambulatory Visit: Payer: Self-pay

## 2020-10-14 ENCOUNTER — Ambulatory Visit (INDEPENDENT_AMBULATORY_CARE_PROVIDER_SITE_OTHER): Payer: BC Managed Care – PPO | Admitting: Podiatry

## 2020-10-14 ENCOUNTER — Ambulatory Visit (INDEPENDENT_AMBULATORY_CARE_PROVIDER_SITE_OTHER): Payer: BC Managed Care – PPO

## 2020-10-14 DIAGNOSIS — M2011 Hallux valgus (acquired), right foot: Secondary | ICD-10-CM | POA: Diagnosis not present

## 2020-10-14 DIAGNOSIS — Z01818 Encounter for other preprocedural examination: Secondary | ICD-10-CM

## 2020-10-14 DIAGNOSIS — Z9889 Other specified postprocedural states: Secondary | ICD-10-CM

## 2020-10-18 ENCOUNTER — Encounter: Payer: Self-pay | Admitting: Podiatry

## 2020-10-18 NOTE — Progress Notes (Signed)
  Subjective:  Patient ID: Lori Anderson, female    DOB: 1996-02-13,  MRN: [id number]  Chief Complaint  Patient presents with  . Routine Post Op    POV#1 -pt states pain and swelling is less; 8/10. -pt denies N/V/F/Ch - dressing dry, clean and intact Tx: icing, boot and heat    DOS: 09/05/2020 Procedure: Right [place] bunionectomy  25 y.o. female returns for post-op check.  Patient states that she is doing well.  She denies any other acute complaints.  She has been healing well.  She has been wearing her boot.  It does not hurt as much.  There is less swelling.  She was doing postop care with Dr. Signa Kell.  Review of Systems: Negative except as noted in the HPI. Denies N/V/F/Ch.  No past medical history on file.  Current Outpatient Medications:  .  clindamycin (CLEOCIN T) 1 % lotion, Apply topically., Disp: , Rfl:  .  fluticasone (FLONASE) 50 MCG/ACT nasal spray, Place 2 sprays into both nostrils daily., Disp: 1 g, Rfl: 0 .  ibuprofen (ADVIL) 800 MG tablet, TAKE 1 TABLET(800 MG) BY MOUTH EVERY 6 HOURS AS NEEDED, Disp: 60 tablet, Rfl: 1 .  ibuprofen (ADVIL,MOTRIN) 600 MG tablet, Take 1 tablet (600 mg total) by mouth every 6 (six) hours as needed., Disp: 30 tablet, Rfl: 0 .  LO LOESTRIN FE 1 MG-10 MCG / 10 MCG tablet, Take 1 tablet by mouth daily., Disp: , Rfl:  .  norethindrone-ethinyl estradiol-iron (MICROGESTIN FE,GILDESS FE,LOESTRIN FE) 1.5-30 MG-MCG tablet, Take 1 tablet by mouth daily., Disp: , Rfl:  .  oxyCODONE-acetaminophen (PERCOCET) 5-325 MG tablet, Take 1-2 tablets by mouth every 4 (four) hours as needed for severe pain., Disp: 30 tablet, Rfl: 0 .  tretinoin (RETIN-A) 0.025 % cream, Apply topically., Disp: , Rfl:   Social History   Tobacco Use  Smoking Status Never Smoker  Smokeless Tobacco Never Used    Allergies  Allergen Reactions  . Sulfa Antibiotics Swelling and Anaphylaxis  . Cinnamon Itching   Objective:  There were no vitals filed for this visit. There is  no height or weight on file to calculate BMI. Constitutional Well developed. Well nourished.  Vascular Foot warm and well perfused. Capillary refill normal to all digits.   Neurologic Normal speech. Oriented to person, place, and time. Epicritic sensation to light touch grossly present bilaterally.  Dermatologic Skin healing well without signs of infection. Skin edges well coapted without signs of infection.  Orthopedic: Tenderness to palpation noted about the surgical site.   Radiographs: 3 views of skeletally mature the right foot: Good correction alignment noted.  Hardware is intact.  Sesamoid position in rectus position. Assessment:   1. Status post foot surgery   2. Hallux abducto valgus, right    Plan:  Patient was evaluated and treated and all questions answered.  S/p foot surgery right -Progressing as expected post-operatively. -XR: See above -WB Status: Weightbearing as tolerated in regular shoes -Sutures: None.  No dehiscence noted.  Skin has completely reepithelialized -Medications: None -Foot redressed.  No follow-ups on file.

## 2020-10-19 ENCOUNTER — Ambulatory Visit (HOSPITAL_COMMUNITY)
Admission: EM | Admit: 2020-10-19 | Discharge: 2020-10-19 | Disposition: A | Discharge status: Home / Self Care | Payer: BC Managed Care – PPO | Attending: Family Medicine | Admitting: Family Medicine

## 2020-10-19 ENCOUNTER — Other Ambulatory Visit: Payer: Self-pay

## 2020-10-19 NOTE — ED Triage Notes (Signed)
Pt did not answer when called from front lobby . Registration staff rreported Pt did not answer when called .

## 2020-11-11 ENCOUNTER — Encounter: Payer: Self-pay | Admitting: Podiatry

## 2020-11-11 ENCOUNTER — Ambulatory Visit (INDEPENDENT_AMBULATORY_CARE_PROVIDER_SITE_OTHER): Payer: BC Managed Care – PPO

## 2020-11-11 ENCOUNTER — Ambulatory Visit (INDEPENDENT_AMBULATORY_CARE_PROVIDER_SITE_OTHER): Payer: BC Managed Care – PPO | Admitting: Podiatry

## 2020-11-11 ENCOUNTER — Other Ambulatory Visit: Payer: Self-pay

## 2020-11-11 DIAGNOSIS — M419 Scoliosis, unspecified: Secondary | ICD-10-CM | POA: Insufficient documentation

## 2020-11-11 DIAGNOSIS — Z9889 Other specified postprocedural states: Secondary | ICD-10-CM

## 2020-11-11 DIAGNOSIS — E7800 Pure hypercholesterolemia, unspecified: Secondary | ICD-10-CM | POA: Insufficient documentation

## 2020-11-11 DIAGNOSIS — M2011 Hallux valgus (acquired), right foot: Secondary | ICD-10-CM

## 2020-11-11 DIAGNOSIS — D573 Sickle-cell trait: Secondary | ICD-10-CM | POA: Insufficient documentation

## 2020-11-11 DIAGNOSIS — Z6710 Type A blood, Rh positive: Secondary | ICD-10-CM | POA: Insufficient documentation

## 2020-11-11 NOTE — Progress Notes (Signed)
  Subjective:  Patient ID: Lori Anderson, female    DOB: 1996/03/14,  MRN: [id number]  Chief Complaint  Patient presents with  . Routine Post Op    Post op. Hallux abducto valgus    DOS: 09/05/2020 Procedure: Right [place] bunionectomy  25 y.o. female returns for post-op check.  Patient states that she is doing well.  She denies any other acute complaints.  She has been healing well.  She has been wearing her boot.  It does not hurt as much.  There is less swelling.  She was doing postop care with Dr. Signa Kell.  Review of Systems: Negative except as noted in the HPI. Denies N/V/F/Ch.  No past medical history on file.  Current Outpatient Medications:  .  clindamycin (CLEOCIN T) 1 % lotion, Apply topically., Disp: , Rfl:  .  fluticasone (FLONASE) 50 MCG/ACT nasal spray, Place 2 sprays into both nostrils daily., Disp: 1 g, Rfl: 0 .  ibuprofen (ADVIL) 800 MG tablet, TAKE 1 TABLET(800 MG) BY MOUTH EVERY 6 HOURS AS NEEDED, Disp: 60 tablet, Rfl: 1 .  ibuprofen (ADVIL,MOTRIN) 600 MG tablet, Take 1 tablet (600 mg total) by mouth every 6 (six) hours as needed., Disp: 30 tablet, Rfl: 0 .  LO LOESTRIN FE 1 MG-10 MCG / 10 MCG tablet, Take 1 tablet by mouth daily., Disp: , Rfl:  .  norethindrone-ethinyl estradiol-iron (MICROGESTIN FE,GILDESS FE,LOESTRIN FE) 1.5-30 MG-MCG tablet, Take 1 tablet by mouth daily., Disp: , Rfl:  .  oxyCODONE-acetaminophen (PERCOCET) 5-325 MG tablet, Take 1-2 tablets by mouth every 4 (four) hours as needed for severe pain., Disp: 30 tablet, Rfl: 0 .  tretinoin (RETIN-A) 0.025 % cream, Apply topically., Disp: , Rfl:   Social History   Tobacco Use  Smoking Status Never Smoker  Smokeless Tobacco Never Used    Allergies  Allergen Reactions  . Sulfa Antibiotics Swelling and Anaphylaxis  . Cinnamon Itching   Objective:  There were no vitals filed for this visit. There is no height or weight on file to calculate BMI. Constitutional Well developed. Well nourished.   Vascular Foot warm and well perfused. Capillary refill normal to all digits.   Neurologic Normal speech. Oriented to person, place, and time. Epicritic sensation to light touch grossly present bilaterally.  Dermatologic  skin completely epithelialized.  Good range of motion noted at the first metatarsophalangeal joint.  No crepitus noted.  No pain on palpation to the medial eminence.  Good correction alignment noted status post surgery  Orthopedic:  No tenderness to palpation noted about the surgical site.   Radiographs: 3 views of skeletally mature the right foot: Good correction alignment noted.  Hardware is intact.  Sesamoid position in rectus position. Assessment:   1. Status post foot surgery   2. Hallux abducto valgus, right    Plan:  Patient was evaluated and treated and all questions answered.  S/p foot surgery right -Progressing as expected post-operatively. -XR: See above -WB Status: Weightbearing as tolerated in regular shoes -Sutures: None.  No dehiscence noted.  Skin has completely reepithelialized -Medications: None -Patient is officially discharged from my care.  If any foot and ankle issues arise in future come back and see me.  Patient states understanding  Return if symptoms worsen or fail to improve.

## 2020-11-15 ENCOUNTER — Other Ambulatory Visit: Payer: Self-pay | Admitting: Podiatry
